# Patient Record
Sex: Female | Born: 1965 | Race: White | Hispanic: No | Marital: Single | Smoking: Never smoker
Health system: Southern US, Community
[De-identification: ages and names within clinical notes are randomized; demographics above are authoritative.]

## PROBLEM LIST (undated history)

## (undated) DIAGNOSIS — R569 Unspecified convulsions: Secondary | ICD-10-CM

## (undated) DIAGNOSIS — M549 Dorsalgia, unspecified: Secondary | ICD-10-CM

## (undated) HISTORY — DX: Dorsalgia, unspecified: M54.9

## (undated) HISTORY — DX: Unspecified convulsions: R56.9

---

## 2008-09-23 ENCOUNTER — Encounter: Admission: RE | Admit: 2008-09-23 | Discharge: 2008-09-23 | Payer: Self-pay | Admitting: Family Medicine

## 2012-05-26 ENCOUNTER — Encounter (HOSPITAL_COMMUNITY): Payer: Self-pay

## 2012-05-26 ENCOUNTER — Emergency Department (HOSPITAL_COMMUNITY)
Admission: EM | Admit: 2012-05-26 | Discharge: 2012-05-26 | Disposition: A | Discharge status: Home / Self Care | Payer: Self-pay | Attending: Emergency Medicine | Admitting: Emergency Medicine

## 2012-05-26 ENCOUNTER — Emergency Department (HOSPITAL_COMMUNITY): Payer: Self-pay

## 2012-05-26 DIAGNOSIS — R51 Headache: Secondary | ICD-10-CM | POA: Insufficient documentation

## 2012-05-26 DIAGNOSIS — S0180XA Unspecified open wound of other part of head, initial encounter: Secondary | ICD-10-CM | POA: Insufficient documentation

## 2012-05-26 DIAGNOSIS — R55 Syncope and collapse: Secondary | ICD-10-CM | POA: Insufficient documentation

## 2012-05-26 DIAGNOSIS — W1811XA Fall from or off toilet without subsequent striking against object, initial encounter: Secondary | ICD-10-CM | POA: Insufficient documentation

## 2012-05-26 DIAGNOSIS — S0990XA Unspecified injury of head, initial encounter: Secondary | ICD-10-CM

## 2012-05-26 DIAGNOSIS — R42 Dizziness and giddiness: Secondary | ICD-10-CM | POA: Insufficient documentation

## 2012-05-26 DIAGNOSIS — Z79899 Other long term (current) drug therapy: Secondary | ICD-10-CM | POA: Insufficient documentation

## 2012-05-26 DIAGNOSIS — IMO0002 Reserved for concepts with insufficient information to code with codable children: Secondary | ICD-10-CM

## 2012-05-26 LAB — POCT I-STAT, CHEM 8
BUN: 10 mg/dL (ref 6–23)
Creatinine, Ser: 0.7 mg/dL (ref 0.50–1.10)
Hemoglobin: 13.3 g/dL (ref 12.0–15.0)
TCO2: 24 mmol/L (ref 0–100)

## 2012-05-26 LAB — CBC
HCT: 39 % (ref 36.0–46.0)
MCH: 28.8 pg (ref 26.0–34.0)
MCV: 83.7 fL (ref 78.0–100.0)
RDW: 13 % (ref 11.5–15.5)
WBC: 5.6 10*3/uL (ref 4.0–10.5)

## 2012-05-26 MED ORDER — BACITRACIN 500 UNIT/GM EX OINT
1.0000 "application " | TOPICAL_OINTMENT | Freq: Two times a day (BID) | CUTANEOUS | Status: DC
  Administered 2012-05-26: 1 via TOPICAL
  Filled 2012-05-26: qty 0.9

## 2012-05-26 MED ORDER — NAPROXEN 500 MG PO TABS: 500.0000 mg | ORAL_TABLET | Freq: Two times a day (BID) | ORAL | Status: AC

## 2012-05-26 NOTE — ED Notes (Signed)
Per EMS, pt was sitting on a toilet, felt dizzy and doesn't remember anything after that. Pt. Found face down on floor by daughter. Laceration to head, bleeding controlled. Alert and oriented x4.

## 2012-05-26 NOTE — ED Notes (Signed)
Prescription given with discharge instructions.  

## 2012-05-26 NOTE — ED Provider Notes (Signed)
History     CSN: [id number]  Arrival date & time 05/26/12  0346   First MD Initiated Contact with Patient 05/26/12 0356      Chief Complaint  Patient presents with  . Fall  . Head Laceration    (Consider location/radiation/quality/duration/timing/severity/associated sxs/prior treatment) HPI Comments: 46 year old female who states that she had a syncopal episode while she was sitting on the commode. This occurred just prior to arrival, she fell face first hitting a metal bar on her shower stall causing a laceration of her for head on the right side, she does not remember this head injury, she awoke on the floor face down with lots of blood.   The patient states that she went to sleep feeling well last night, no complaints of chest pain, palpitations, abdominal pain, dizziness, change in vision, numbness weakness or difficulty walking. At this time her only complaint is a headache. Her medics found her with normal vital signs, no abnormal neurologic deficits and immobilized with a c-collar and backboard.   The history is provided by the patient, the EMS personnel and a relative.    History reviewed. No pertinent past medical history.  History reviewed. No pertinent past surgical history.  No family history on file.  History  Substance Use Topics  . Smoking status: Not on file  . Smokeless tobacco: Not on file  . Alcohol Use: Not on file    OB History    Grav Para Term Preterm Abortions TAB SAB Ect Mult Living                  Review of Systems  All other systems reviewed and are negative.    Allergies  Review of patient's allergies indicates no known allergies.  Home Medications   Current Outpatient Rx  Name Route Sig Dispense Refill  . IBUPROFEN 200 MG PO TABS Oral Take 400 mg by mouth every 6 (six) hours as needed. Headache or pain    . NAPROXEN 500 MG PO TABS Oral Take 1 tablet (500 mg total) by mouth 2 (two) times daily with a meal. 30 tablet 0    BP 131/84   Pulse 74  Temp 98.3 F (36.8 C) (Oral)  Resp 18  SpO2 98%  LMP 04/28/2012  Physical Exam  Nursing note and vitals reviewed. Constitutional: She appears well-developed and well-nourished. No distress.  HENT:  Head: Normocephalic.  Mouth/Throat: Oropharynx is clear and moist. No oropharyngeal exudate.       Laceration to right forehead, no hemotympanum, malocclusion, raccoon eyes, battle sign  Eyes: Conjunctivae normal and EOM are normal. Pupils are equal, round, and reactive to light. Right eye exhibits no discharge. Left eye exhibits no discharge. No scleral icterus.  Neck: No JVD present. No thyromegaly present.  Cardiovascular: Normal rate, regular rhythm, normal heart sounds and intact distal pulses.  Exam reveals no gallop and no friction rub.   No murmur heard. Pulmonary/Chest: Effort normal and breath sounds normal. No respiratory distress. She has no wheezes. She has no rales. She exhibits no tenderness.  Abdominal: Soft. Bowel sounds are normal. She exhibits no distension and no mass. There is no tenderness.  Musculoskeletal: Normal range of motion. She exhibits no edema and no tenderness.       No tenderness over the cervical thoracic or lumbar spines  Lymphadenopathy:    She has no cervical adenopathy.  Neurological: She is alert. Coordination normal.       Speech is clear, movements are normal,, coordination  is normal, no limb ataxia, memory is intact other than short period during syncopal event.  Skin: Skin is warm and dry. No rash noted. No erythema.       Laceration to right forehead  Psychiatric: She has a normal mood and affect. Her behavior is normal.    ED Course  Procedures (including critical care time)  Labs Reviewed  POCT I-STAT, CHEM 8 - Abnormal; Notable for the following:    Glucose, Bld 124 (*)     All other components within normal limits  CBC   Ct Head Wo Contrast  05/26/2012  *RADIOLOGY REPORT*  Clinical Data:  Headache.  Dizziness.  Fall.   Laceration to the head.  CT HEAD WITHOUT CONTRAST CT CERVICAL SPINE WITHOUT CONTRAST  Technique:  Multidetector CT imaging of the head and cervical spine was performed following the standard protocol without intravenous contrast.  Multiplanar CT image reconstructions of the cervical spine were also generated.  Comparison:   None  CT HEAD  Findings: The ventricles and sulci are symmetrical without significant effacement, displacement, or dilatation. No mass effect or midline shift. No abnormal extra-axial fluid collections. The grey-white matter junction is distinct. Basal cisterns are not effaced. No acute intracranial hemorrhage. No depressed skull fractures.  Visualized paranasal sinuses and mastoid air cells are not opacified.  IMPRESSION: No acute intracranial abnormalities.  CT CERVICAL SPINE  Findings: Technically limited study due to motion artifact.  Normal alignment of the cervical vertebrae and facet joints.  Degenerative changes with disc space narrowing and endplate hypertrophic changes throughout.  Degenerative changes in the facet joints.  Normal alignment of the facet joints.  No vertebral compression deformities.  No prevertebral soft tissue swelling.  Lateral masses of C1 are symmetrical.  The odontoid process appears intact.  No focal bone lesion or bone destruction.  Bone cortex and trabecular architecture appear intact.  Incidental note of a nodule in the left lung apex posteriorly measuring about 10 mm diameter.  IMPRESSION: Degenerative changes throughout the cervical spine.  No displaced fractures identified.    Indeterminate 1-cm nodule in the left lung apex.  Chest CT recommended for further evaluation.   Original Report Authenticated By: Marlon Pel, M.D.    Ct Cervical Spine Wo Contrast  05/26/2012  *RADIOLOGY REPORT*  Clinical Data:  Headache.  Dizziness.  Fall.  Laceration to the head.  CT HEAD WITHOUT CONTRAST CT CERVICAL SPINE WITHOUT CONTRAST  Technique:  Multidetector CT  imaging of the head and cervical spine was performed following the standard protocol without intravenous contrast.  Multiplanar CT image reconstructions of the cervical spine were also generated.  Comparison:   None  CT HEAD  Findings: The ventricles and sulci are symmetrical without significant effacement, displacement, or dilatation. No mass effect or midline shift. No abnormal extra-axial fluid collections. The grey-white matter junction is distinct. Basal cisterns are not effaced. No acute intracranial hemorrhage. No depressed skull fractures.  Visualized paranasal sinuses and mastoid air cells are not opacified.  IMPRESSION: No acute intracranial abnormalities.  CT CERVICAL SPINE  Findings: Technically limited study due to motion artifact.  Normal alignment of the cervical vertebrae and facet joints.  Degenerative changes with disc space narrowing and endplate hypertrophic changes throughout.  Degenerative changes in the facet joints.  Normal alignment of the facet joints.  No vertebral compression deformities.  No prevertebral soft tissue swelling.  Lateral masses of C1 are symmetrical.  The odontoid process appears intact.  No focal bone lesion or bone destruction.  Bone cortex and trabecular architecture appear intact.  Incidental note of a nodule in the left lung apex posteriorly measuring about 10 mm diameter.  IMPRESSION: Degenerative changes throughout the cervical spine.  No displaced fractures identified.    Indeterminate 1-cm nodule in the left lung apex.  Chest CT recommended for further evaluation.   Original Report Authenticated By: Marlon Pel, M.D.      1. Minor head injury   2. Laceration       MDM  CT scan to rule out cranial hemorrhage, fracture, cervical spine injury. EKG appears normal without any signs of arrhythmia or ischemia, check and to rule out anemia, electrolytes, laceration repair. Patient appears well at this time  ED ECG REPORT  I personally interpreted this  EKG   Date: 05/26/2012   Rate: 73  Rhythm: normal sinus rhythm  QRS Axis: normal  Intervals: normal  ST/T Wave abnormalities: normal  Conduction Disutrbances:none  Narrative Interpretation:   Old EKG Reviewed: none available   CT scan reviewed, no signs of intracranial hemorrhage, brain injury or cervical spine injury. Laceration repaired, patient is at baseline mental status, she has had no seizures, vomiting, changes in vision and is at her baseline at this time.  LACERATION REPAIR Performed by: Vida Roller Authorized by: Vida Roller Consent: Verbal consent obtained. Risks and benefits: risks, benefits and alternatives were discussed Consent given by: patient Patient identity confirmed: provided demographic data Prepped and Draped in normal sterile fashion Wound explored  Laceration Location: Right for head  Laceration Length: 7 cm  No Foreign Bodies seen or palpated  Anesthesia: local infiltration  Local anesthetic: lidocaine 1 % with epinephrine  Anesthetic total: 6 ml  Irrigation method: syringe Amount of cleaning: standard  Skin closure: 6-0 Prolene  Number of sutures: 11   Technique: Simple interrupted   Patient tolerance: Patient tolerated the procedure well with no immediate complications.       Vida Roller, MD 05/26/12 [phone]

## 2013-01-07 ENCOUNTER — Encounter: Payer: Self-pay | Admitting: Neurology

## 2013-01-07 ENCOUNTER — Other Ambulatory Visit (INDEPENDENT_AMBULATORY_CARE_PROVIDER_SITE_OTHER): Payer: BC Managed Care – PPO | Admitting: Radiology

## 2013-01-07 ENCOUNTER — Ambulatory Visit (INDEPENDENT_AMBULATORY_CARE_PROVIDER_SITE_OTHER): Payer: BC Managed Care – PPO | Admitting: Neurology

## 2013-01-07 VITALS — BP 120/87 | Ht 66.0 in | Wt 232.0 lb

## 2013-01-07 DIAGNOSIS — R569 Unspecified convulsions: Secondary | ICD-10-CM

## 2013-01-07 MED ORDER — LEVETIRACETAM ER 500 MG PO TB24: 1000.0000 mg | ORAL_TABLET | Freq: Every day | ORAL | Status: DC

## 2013-01-07 NOTE — Progress Notes (Signed)
Brittany Pearson is a 47 years old right-handed Caucasian female, accompanied by her mother, referred by her primary care physician for evaluation of seizure  She reported  a history of history of seizures since age 48, in her twenties, she had about 8-9 generalize tonic-clonic seizure, she was treated with Dilantin,  Depakote, Because of the frequent recurrent seizure, all her antileptic medications was stopped.   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   Constitutional:   N/A Cardiovascular:  N/A Ear/Nose/Throat:  N/A Skin: moles Eyes: N/A Respiratory: snoring Gastroitestinal: N/A    Hematology/Lymphatic:  N/A Endocrine:  N/A Musculoskeletal: joint pain Allergy/Immunology: N/A Neurological:  Memory loss, headache, dizziness, seizure, snoring Psychiatric:    N/A  PHYSICAL EXAMINATOINS:  Generalized: In no acute distress  Neck: Supple, no carotid bruits   Cardiac: Regular rate rhythm  Pulmonary: Clear to auscultation bilaterally  Musculoskeletal: No deformity  Neurological examination  Mentation: Alert oriented to time, place, history taking, and causual conversation  Cranial nerve II-XII: Pupils were equal round reactive to light extraocular movements were full, visual field were full on confrontational test. facial sensation and strength were normal. hearing was intact to finger rubbing  bilaterally. Uvula tongue midline.  head turning and shoulder shrug and were normal and symmetric.Tongue protrusion into cheek strength was normal.  Motor: normal tone, bulk and strength.  Sensory: Intact to fine touch, pinprick, preserved vibratory sensation, and proprioception at toes.  Coordination: Normal finger to nose, heel-to-shin bilaterally there was no truncal ataxia  Gait: Rising up from seated position without assistance, normal stance, without trunk ataxia, moderate stride, good arm swing, smooth turning, able to perform tiptoe, and heel walking without difficulty.   Romberg signs: Negative  Deep tendon reflexes: Brachioradialis 2/2, biceps 2/2, triceps 2/2, patellar 2/2, Achilles 2/2, plantar responses were flexor bilaterally.   Assessment and plan:  48 Years old Caucasian female, with past medical history of generalized seizure, now presenting with recurrent seizure, normal neurological examination  1. MRI brain w/wo 2. EEG. 3. Keppra xr 1000mg  every night 4. no driving until seizure free for 6 months,  5, return to clinic with Eber Jones in 6 months

## 2013-01-07 NOTE — Patient Instructions (Signed)
No driving until seizure-free for 6 months 

## 2013-01-10 ENCOUNTER — Other Ambulatory Visit: Payer: BC Managed Care – PPO | Admitting: Radiology

## 2013-01-11 NOTE — Procedures (Signed)
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 One channel was dedicated to EKG, which has demonstrates normal sinus rhythm of 72 beats per minutes.  Upon awakening, the posterior background activity was well-developed, in alpha range, 9-10 Hz , reactive to eye opening and closure.  There was no evidence of epilepsy for discharge.  Photic stimulation was performed, which induced a symmetric photic driving.  Hyperventilation was performed, there was no abnormality elicit.  Stage II sleep was achieved, as evident by K complex, sleep spindles  CONCLUSION: This is a  normal awake and asleep EEG.  There is no electrodiagnostic evidence of epileptiform discharge

## 2013-01-27 ENCOUNTER — Ambulatory Visit (INDEPENDENT_AMBULATORY_CARE_PROVIDER_SITE_OTHER): Payer: BC Managed Care – PPO

## 2013-01-27 DIAGNOSIS — R569 Unspecified convulsions: Secondary | ICD-10-CM

## 2013-01-28 MED ORDER — GADOBENATE DIMEGLUMINE 529 MG/ML IV SOLN
20.0000 mL | Freq: Once | INTRAVENOUS | Status: AC | PRN
  Administered 2013-01-27: 20 mL via INTRAVENOUS

## 2013-03-03 ENCOUNTER — Other Ambulatory Visit: Payer: Self-pay

## 2013-03-03 MED ORDER — LEVETIRACETAM ER 500 MG PO TB24: 1000.0000 mg | ORAL_TABLET | Freq: Every day | ORAL | Status: DC

## 2013-07-11 ENCOUNTER — Ambulatory Visit: Payer: BC Managed Care – PPO | Admitting: Neurology

## 2013-11-05 ENCOUNTER — Other Ambulatory Visit: Payer: Self-pay | Admitting: Neurology

## 2013-11-08 NOTE — Telephone Encounter (Signed)
Patient no showed last appt.  I called patient, got no answer.  Left message asking that she call back and reschedule appt.

## 2013-12-05 ENCOUNTER — Ambulatory Visit (INDEPENDENT_AMBULATORY_CARE_PROVIDER_SITE_OTHER): Payer: BC Managed Care – PPO | Admitting: Neurology

## 2013-12-05 ENCOUNTER — Encounter (INDEPENDENT_AMBULATORY_CARE_PROVIDER_SITE_OTHER): Payer: Self-pay

## 2013-12-05 ENCOUNTER — Encounter: Payer: Self-pay | Admitting: Neurology

## 2013-12-05 VITALS — BP 137/87 | HR 78 | Ht 67.0 in | Wt 234.0 lb

## 2013-12-05 DIAGNOSIS — G40409 Other generalized epilepsy and epileptic syndromes, not intractable, without status epilepticus: Secondary | ICD-10-CM | POA: Insufficient documentation

## 2013-12-05 DIAGNOSIS — G40309 Generalized idiopathic epilepsy and epileptic syndromes, not intractable, without status epilepticus: Secondary | ICD-10-CM

## 2013-12-05 MED ORDER — LEVETIRACETAM ER 500 MG PO TB24: 1000.0000 mg | ORAL_TABLET | Freq: Every day | ORAL | Status: DC

## 2013-12-05 NOTE — Progress Notes (Signed)
Brittany Pearson is a 48 years old right-handed Caucasian female, accompanied by her mother, referred by her primary care physician for evaluation of seizure  She reported  a history of history of seizures since age 48, in her twenties, she had about 8-9 generalize tonic-clonic seizure, she was treated with Dilantin,  Depakote, Because she has no recurrent seizure, all her antileptic medications was stopped.   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 MRI of the brain with without contrast, was normal, EEG was normal   Review of systems   Out of a complete 14 system review, the patient complains of only the following symptoms, and all other reviewed systems are negative.  Joint pain, back pain, runny nose   PHYSICAL EXAMINATOINS:  Generalized: In no acute distress  Neck: Supple, no carotid bruits   Cardiac: Regular rate rhythm  Pulmonary: Clear to auscultation bilaterally  Musculoskeletal: No deformity  Neurological examination  Mentation: Alert oriented to time, place, history taking, and causual conversation  Cranial nerve II-XII: Pupils were equal round reactive to light extraocular movements were full, visual field were full on confrontational test. facial sensation and strength were normal. hearing was intact to finger rubbing bilaterally. Uvula tongue midline.  head turning and shoulder shrug and were normal and symmetric.Tongue protrusion into cheek  strength was normal.  Motor: normal tone, bulk and strength.  Sensory: Intact to fine touch, pinprick, preserved vibratory sensation, and proprioception at toes.  Coordination: Normal finger to nose, heel-to-shin bilaterally there was no truncal ataxia  Gait: Rising up from seated position without assistance, mild  atalgic gait due to right foot pain.   Romberg signs: Negative  Deep tendon reflexes: Brachioradialis 2/2, biceps 2/2, triceps 2/2, patellar 2/2, Achilles 2/2, plantar responses were flexor bilaterally.   Assessment and plan:  48 Years old Caucasian female, with past medical history of generalized seizure, now presenting with recurrent seizure, normal neurological examination  1 refill her Keppra xr 500 mg 2 tablets a day  2, return to clinic in one year with Eber Jonesarolyn

## 2014-09-18 ENCOUNTER — Encounter: Payer: Self-pay | Admitting: Adult Health

## 2014-09-18 ENCOUNTER — Ambulatory Visit (INDEPENDENT_AMBULATORY_CARE_PROVIDER_SITE_OTHER): Payer: BLUE CROSS/BLUE SHIELD | Admitting: Adult Health

## 2014-09-18 ENCOUNTER — Ambulatory Visit: Payer: BC Managed Care – PPO | Admitting: Nurse Practitioner

## 2014-09-18 VITALS — BP 125/83 | HR 81 | Ht 68.0 in | Wt 245.5 lb

## 2014-09-18 DIAGNOSIS — G40409 Other generalized epilepsy and epileptic syndromes, not intractable, without status epilepticus: Secondary | ICD-10-CM

## 2014-09-18 DIAGNOSIS — G40309 Generalized idiopathic epilepsy and epileptic syndromes, not intractable, without status epilepticus: Secondary | ICD-10-CM

## 2014-09-18 MED ORDER — LEVETIRACETAM ER 500 MG PO TB24: 1500.0000 mg | ORAL_TABLET | Freq: Every day | ORAL | Status: DC

## 2014-09-18 NOTE — Progress Notes (Signed)
PATIENT: Brittany Pearson DOB: Jul 14, 1966  REASON FOR VISIT: follow up HISTORY FROM: patient  HISTORY OF PRESENT ILLNESS: Ms. Brittany Pearson is a 49 year old female with a history of seizures. She returns today for follow-up. She is currently taking Keppra 1000 mg at bedtime. She reports that her seizures have been controlled until last week. She has not had a seizure but continues to have the sensation that a seizure is coming. Her chest will get tight and  lightheaded as if she is going to pass out. She states that she will sit down and that feeling will slowly subside. The last time this occur was last Thursday but before that it occurred 5 days in a row. This is the same sensation that has occurred in the past before having a seizure. Her daughter just had a baby and they live with her. She thinks that is causing her a lot of stress. In the past she states that stress has caused her to have seizures. She states that she is sleeping well at night.   HISTORY 12/05/13 [name]): by her primary care physician for evaluation of seizure She reported a history of history of seizures since age 52, in her twenties, she had about 8-9 generalize tonic-clonic seizure, she was treated with Dilantin, Depakote, Because she has no recurrent seizure, all her antileptic medications was stopped. 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 MRI of the brain with without contrast, was normal,  EEG was normal  REVIEW OF SYSTEMS: Out of a complete 14 system review of symptoms, the patient complains only of the following symptoms, and all other reviewed systems are negative.  Fatigue, chest tightness, agitation, depression, dizziness, weakness   ALLERGIES: No Known Allergies  HOME MEDICATIONS: Outpatient Prescriptions Prior to Visit  Medication Sig Dispense Refill  . fluticasone (FLONASE) 50 MCG/ACT nasal spray     . ibuprofen (ADVIL,MOTRIN) 200 MG tablet Take 400 mg by mouth every 6 (six) hours as needed. Headache or pain    . levETIRAcetam (KEPPRA XR) 500 MG 24 hr tablet Take 2 tablets (1,000 mg total) by mouth at bedtime. 190 tablet 3   No facility-administered medications prior to visit.    PAST MEDICAL HISTORY: Past Medical History  Diagnosis Date  . Seizure   . Back pain     PAST SURGICAL HISTORY: Past Surgical History  Procedure Laterality Date  . Cesarean section  2006    FAMILY HISTORY: Family History  Problem Relation Age of Onset  . High blood pressure Mother     SOCIAL HISTORY: History   Social History  . Marital Status: Married    Spouse Name: N/A    Number of Children: 2  . Years of Education: 12   Occupational History  . Cook    Social History Main Topics  . Smoking status: Never Smoker   . Smokeless tobacco: Never Used  . Alcohol Use: 0.6 oz/week    1  Glasses of wine per week     Comment: OCC  . Drug Use: No  . Sexual Activity: Not on file   Other Topics Concern  . Not on file   Social History Narrative   Patient lives at home with her children she is divorced.   Patient works full time for holiday retirement and part time for [employer].   Education high school education.   Right handed   Caffeine six cups daily of soda.            PHYSICAL EXAM  Filed Vitals:   09/18/14 1504  BP: 125/83  Pulse: 81  Height:  (1.727 m)  Weight: 245 lb 8 oz (111.358 kg)   Body mass index is 37.34  kg/(m^2).  Generalized: Well developed, in no acute distress   Neurological examination  Mentation: Alert oriented to time, place, history taking. Follows all commands speech and language fluent Cranial nerve II-XII: Pupils were equal round reactive to light. Extraocular movements were full, visual field were full on confrontational test. Facial sensation and strength were normal. Uvula tongue midline. Head turning and shoulder shrug  were normal and symmetric. Motor: The motor testing reveals 5 over 5 strength of all 4 extremities. Good symmetric motor tone is noted throughout.  Sensory: Sensory testing is intact to soft touch on all 4 extremities. No evidence of extinction is noted.  Coordination: Cerebellar testing reveals good finger-nose-finger and heel-to-shin bilaterally.  Gait and station: Gait is normal. Tandem gait is normal. Romberg is negative. No drift is seen.  Reflexes: Deep tendon reflexes are symmetric and normal bilaterally.    DIAGNOSTIC DATA (LABS, IMAGING, TESTING) - I reviewed patient records, labs, notes, testing and imaging myself where available.  Lab Results  Component Value Date   WBC 5.6 05/26/2012   HGB 13.3 05/26/2012   HCT 39.0 05/26/2012   MCV 83.7 05/26/2012   PLT 172 05/26/2012      Component Value Date/Time   NA 139 05/26/2012 0415   K 3.7 05/26/2012 0415   CL 103 05/26/2012 0415   GLUCOSE 124* 05/26/2012 0415   BUN 10 05/26/2012 0415   CREATININE 0.70 05/26/2012 0415      ASSESSMENT AND PLAN 49 y.o. year old female  has a past medical history of Seizure and Back pain. here with:  1. Seizures  The patient states that starting last week she had 5 days of feeling like she is going to have a seizure. She states that this same sensation has occurred in the past when a seizure did occur. I will increase the Keppra XR to 1500 mg daily at bedtime. She is to let us know if this sensation continues or if she actually has a seizure. She will follow  up in 3 months or sooner if needed.    Butch Penny, MSN, NP-C 09/18/2014, 3:01 PM [facility] [address] [place], [place] [address] [phone]  Note: This document was prepared with digital dictation and possible smart phrase technology. Any transcriptional errors that result from this process are unintentional.

## 2014-09-18 NOTE — Patient Instructions (Signed)
Increase Keppra to 1500 mg at bedtime.  If you have any seizures please let us know.  If you continue to have the sensation that a seizure is going to occur please let us know so your medication can continue to be adjusted.

## 2014-09-22 NOTE — Progress Notes (Signed)
I agree above plan. 

## 2014-10-31 ENCOUNTER — Telehealth: Payer: Self-pay

## 2014-10-31 NOTE — Telephone Encounter (Signed)
Called patient and spoke to her about rescheduling her apt. With [facility]. 12-06-14 at 3:00 . [place] and she only can have Afternoon apt's . Patient will see Dr.Yan.

## 2014-12-05 ENCOUNTER — Ambulatory Visit (INDEPENDENT_AMBULATORY_CARE_PROVIDER_SITE_OTHER): Payer: BLUE CROSS/BLUE SHIELD | Admitting: Neurology

## 2014-12-05 ENCOUNTER — Encounter: Payer: Self-pay | Admitting: Neurology

## 2014-12-05 VITALS — BP 139/87 | HR 84 | Ht 68.0 in | Wt 234.0 lb

## 2014-12-05 DIAGNOSIS — G40309 Generalized idiopathic epilepsy and epileptic syndromes, not intractable, without status epilepticus: Secondary | ICD-10-CM

## 2014-12-05 DIAGNOSIS — G40409 Other generalized epilepsy and epileptic syndromes, not intractable, without status epilepticus: Secondary | ICD-10-CM

## 2014-12-05 MED ORDER — LEVETIRACETAM ER 500 MG PO TB24: 1500.0000 mg | ORAL_TABLET | Freq: Every day | ORAL | Status: DC

## 2014-12-05 NOTE — Progress Notes (Signed)
PATIENT: Brittany Pearson DOB: [birth year]  REASON FOR VISIT: follow up HISTORY FROM: patient  HISTORY OF PRESENT ILLNESS: Brittany Pearson is a 49 year old female with a history of seizures. She returns today for follow-up. She is currently taking Keppra 1000 mg at bedtime. She reports that her seizures have been controlled until last week. She has not had a seizure but continues to have the sensation that a seizure is coming. Her chest will get tight and  lightheaded as if she is going to pass out. She states that she will sit down and that feeling will slowly subside. The last time this occur was last Thursday but before that it occurred 5 days in a row. This is the same sensation that has occurred in the past before having a seizure. Her daughter just had a baby and they live with her. She thinks that is causing her a lot of stress. In the past she states that stress has caused her to have seizures. She states that she is sleeping well at night.   HISTORY 12/05/13 [name]): by her primary care physician for evaluation of seizure  She reported a history of history of seizures since age 53, in her twenties, she had about 8-9 generalize tonic-clonic seizure, she was treated with Dilantin, Depakote, Because she has no recurrent seizure, all her antileptic medications was stopped. Last seizure was in 1999, in April 5th 2014 while at work, she felt lightheaded, sitting outside, then had generalized seizure, witnessed by her coworker, woke up on the stretcher, she was taken by the ambulance to [facility], she had bowel and bladder incontinence, she works as a Investment banker, operational, she drove one hour to [employer] every day.She already has history of chronic low back pain, now worsening pain since seizure, midline low back, no shooting pain, no bowel bladder incontinence  UPDATE December 05 2013: She is taking keppra xr  2 tabs qhs, she has no side effect, no recurrent seizure. MRI of the brain with without contrast, was  normal, EEG was normal  UPDATE March 22nd 2016: She has no recurrent seizure like events after Keppra xr was increased to 500 mg 3 tablets every night no significant side effect noticed  REVIEW OF SYSTEMS: Out of a complete 14 system review of symptoms, the patient complains only of the following symptoms, and all other reviewed systems are negative. As above   ALLERGIES: No Known Allergies  HOME MEDICATIONS: Outpatient Prescriptions Prior to Visit  Medication Sig Dispense Refill  . levETIRAcetam (KEPPRA XR) 500 MG 24 hr tablet Take 3 tablets (1,500 mg total) by mouth at bedtime. 270 tablet 3  . fluticasone (FLONASE) 50 MCG/ACT nasal spray     . ibuprofen (ADVIL,MOTRIN) 200 MG tablet Take 400 mg by mouth every 6 (six) hours as needed. Headache or pain     No facility-administered medications prior to visit.    PAST MEDICAL HISTORY: Past Medical History  Diagnosis Date  . Seizure   . Back pain     PAST SURGICAL HISTORY: Past Surgical History  Procedure Laterality Date  . Cesarean section  2006    FAMILY HISTORY: Family History  Problem Relation Age of Onset  . High blood pressure Mother     SOCIAL HISTORY: History   Social History  . Marital Status: Married    Spouse Name: N/A  . Number of Children: 2  . Years of Education: 12   Occupational History  . Cook    Social History Main Topics  .  Smoking status: Never Smoker   . Smokeless tobacco: Never Used  . Alcohol Use: 0.6 oz/week    1 Glasses of wine per week     Comment: OCC  . Drug Use: No  . Sexual Activity: Not on file   Other Topics Concern  . Not on file   Social History Narrative   Patient lives at home with her children she is divorced.   Patient works full time for holiday retirement and part time for [employer].   Education high school education.   Right handed   Caffeine six cups daily of soda.            PHYSICAL EXAM  Filed Vitals:   12/05/14 1552  BP: 139/87  Pulse: 84    Height: 5\' 8"  (1.727 m)  Weight: 234 lb (106.142 kg)   Body mass index is 35.59 kg/(m^2).  PHYSICAL EXAMNIATION:  Gen: NAD, conversant, well nourised, obese, well groomed                     Cardiovascular: Regular rate rhythm, no peripheral edema, warm, nontender. Eyes: Conjunctivae clear without exudates or hemorrhage Neck: Supple, no carotid bruise. Pulmonary: Clear to auscultation bilaterally   NEUROLOGICAL EXAM:  MENTAL STATUS: Speech:    Speech is normal; fluent and spontaneous with normal comprehension.  Cognition:    The patient is oriented to person, place, and time;     recent and remote memory intact;     language fluent;     normal attention, concentration,     fund of knowledge.  CRANIAL NERVES: CN II: Visual fields are full to confrontation. Fundoscopic exam is normal with sharp discs and no vascular changes. Venous pulsations are present bilaterally. Pupils are 4 mm and briskly reactive to light. Visual acuity is 20/20 bilaterally. CN III, IV, VI: extraocular movement are normal. No ptosis. CN V: Facial sensation is intact to pinprick in all 3 divisions bilaterally. Corneal responses are intact.  CN VII: Face is symmetric with normal eye closure and smile. CN VIII: Hearing is normal to rubbing fingers CN IX, X: Palate elevates symmetrically. Phonation is normal. CN XI: Head turning and shoulder shrug are intact CN XII: Tongue is midline with normal movements and no atrophy.  MOTOR: There is no pronator drift of out-stretched arms. Muscle bulk and tone are normal. Muscle strength is normal.   Shoulder abduction Shoulder external rotation Elbow flexion Elbow extension Wrist flexion Wrist extension Finger abduction Hip flexion Knee flexion Knee extension Ankle dorsi flexion Ankle plantar flexion  R 5 5 5 5 5 5 5 5 5 5 5 5   L 5 5 5 5 5 5 5 5 5 5 5 5     REFLEXES: Reflexes are 2+ and symmetric at the biceps, triceps, knees, and ankles. Plantar responses are  flexor.  SENSORY: Light touch, pinprick, position sense, and vibration sense are intact in fingers and toes.  COORDINATION: Rapid alternating movements and fine finger movements are intact. There is no dysmetria on finger-to-nose and heel-knee-shin. There are no abnormal or extraneous movements.   GAIT/STANCE: Posture is normal. Gait is steady with normal steps, base, arm swing, and turning. Heel and toe walking are normal. Tandem gait is normal.  Romberg is absent.    DIAGNOSTIC DATA (LABS, IMAGING, TESTING) - I reviewed patient records, labs, notes, testing and imaging myself where available.  Lab Results  Component Value Date   WBC 5.6 05/26/2012   HGB 13.3 05/26/2012  HCT 39.0 05/26/2012   MCV 83.7 05/26/2012   PLT 172 05/26/2012      Component Value Date/Time   NA 139 05/26/2012 0415   K 3.7 05/26/2012 0415   CL 103 05/26/2012 0415   GLUCOSE 124* 05/26/2012 0415   BUN 10 05/26/2012 0415   CREATININE 0.70 05/26/2012 0415      ASSESSMENT AND PLAN 49 y.o. year old female  has a past medical history of Seizure and Back pain. follow-up for seizure disorder, she no longer has recurrent aurs after increase Keppra xr to  3 tabs qhs    Refill her meds, RTC in one year  Levert Feinstein, M.D. Ph.D.  [facility] [address] [place], [place] [address] Phone: [phone] Fax:      [phone]

## 2014-12-06 ENCOUNTER — Ambulatory Visit: Payer: BC Managed Care – PPO | Admitting: Nurse Practitioner

## 2014-12-19 ENCOUNTER — Ambulatory Visit: Payer: BLUE CROSS/BLUE SHIELD | Admitting: Adult Health

## 2015-09-23 ENCOUNTER — Emergency Department (HOSPITAL_COMMUNITY)
Admission: EM | Admit: 2015-09-23 | Discharge: 2015-09-23 | Disposition: A | Discharge status: Home / Self Care | Payer: Worker's Compensation | Attending: Emergency Medicine | Admitting: Emergency Medicine

## 2015-09-23 ENCOUNTER — Emergency Department (HOSPITAL_COMMUNITY): Payer: Worker's Compensation

## 2015-09-23 ENCOUNTER — Encounter (HOSPITAL_COMMUNITY): Payer: Self-pay

## 2015-09-23 DIAGNOSIS — S79912A Unspecified injury of left hip, initial encounter: Secondary | ICD-10-CM | POA: Insufficient documentation

## 2015-09-23 DIAGNOSIS — W000XXA Fall on same level due to ice and snow, initial encounter: Secondary | ICD-10-CM | POA: Insufficient documentation

## 2015-09-23 DIAGNOSIS — Y998 Other external cause status: Secondary | ICD-10-CM | POA: Diagnosis not present

## 2015-09-23 DIAGNOSIS — S52122A Displaced fracture of head of left radius, initial encounter for closed fracture: Secondary | ICD-10-CM | POA: Diagnosis not present

## 2015-09-23 DIAGNOSIS — Y9289 Other specified places as the place of occurrence of the external cause: Secondary | ICD-10-CM | POA: Diagnosis not present

## 2015-09-23 DIAGNOSIS — Z79899 Other long term (current) drug therapy: Secondary | ICD-10-CM | POA: Diagnosis not present

## 2015-09-23 DIAGNOSIS — Y9389 Activity, other specified: Secondary | ICD-10-CM | POA: Diagnosis not present

## 2015-09-23 DIAGNOSIS — S4992XA Unspecified injury of left shoulder and upper arm, initial encounter: Secondary | ICD-10-CM | POA: Diagnosis present

## 2015-09-23 DIAGNOSIS — S299XXA Unspecified injury of thorax, initial encounter: Secondary | ICD-10-CM | POA: Insufficient documentation

## 2015-09-23 LAB — CBC WITH DIFFERENTIAL/PLATELET
BASOS ABS: 0 10*3/uL (ref 0.0–0.1)
Basophils Relative: 0 %
Eosinophils Absolute: 0.1 10*3/uL (ref 0.0–0.7)
Eosinophils Relative: 1 %
HEMATOCRIT: 43.4 % (ref 36.0–46.0)
Hemoglobin: 14.8 g/dL (ref 12.0–15.0)
LYMPHS ABS: 1.9 10*3/uL (ref 0.7–4.0)
LYMPHS PCT: 21 %
MCH: 28.5 pg (ref 26.0–34.0)
MCHC: 34.1 g/dL (ref 30.0–36.0)
MCV: 83.5 fL (ref 78.0–100.0)
MONO ABS: 0.6 10*3/uL (ref 0.1–1.0)
MONOS PCT: 7 %
NEUTROS ABS: 6.5 10*3/uL (ref 1.7–7.7)
Neutrophils Relative %: 71 %
Platelets: 260 10*3/uL (ref 150–400)
RBC: 5.2 MIL/uL — ABNORMAL HIGH (ref 3.87–5.11)
RDW: 12.8 % (ref 11.5–15.5)
WBC: 9.2 10*3/uL (ref 4.0–10.5)

## 2015-09-23 LAB — COMPREHENSIVE METABOLIC PANEL
ALT: 29 U/L (ref 14–54)
AST: 36 U/L (ref 15–41)
Albumin: 4.2 g/dL (ref 3.5–5.0)
Alkaline Phosphatase: 67 U/L (ref 38–126)
Anion gap: 9 (ref 5–15)
BILIRUBIN TOTAL: 1.1 mg/dL (ref 0.3–1.2)
BUN: 15 mg/dL (ref 6–20)
CO2: 25 mmol/L (ref 22–32)
CREATININE: 0.77 mg/dL (ref 0.44–1.00)
Calcium: 9.2 mg/dL (ref 8.9–10.3)
Chloride: 105 mmol/L (ref 101–111)
GFR calc Af Amer: >60 mL/min (ref >60)
Glucose, Bld: 111 mg/dL — ABNORMAL HIGH (ref 65–99)
POTASSIUM: 4.5 mmol/L (ref 3.5–5.1)
Sodium: 139 mmol/L (ref 135–145)
TOTAL PROTEIN: 7.7 g/dL (ref 6.5–8.1)

## 2015-09-23 LAB — LIPASE, BLOOD: Lipase: 30 U/L (ref 11–51)

## 2015-09-23 MED ORDER — SODIUM CHLORIDE 0.9 % IV BOLUS (SEPSIS)
1000.0000 mL | Freq: Once | INTRAVENOUS | Status: AC
  Administered 2015-09-23: 1000 mL via INTRAVENOUS

## 2015-09-23 NOTE — ED Notes (Signed)
Patient c/o fall yesterday when she slipped on ice. Patient c/o left arm, left rib, and left hip pain

## 2015-09-23 NOTE — Discharge Instructions (Signed)
Splint and Sling until seen by Orthopedic Hand surgeon.  Radial Head Fracture A radial head fracture is a break of the smaller bone (radius) in the forearm. The head of this bone is the part near the elbow. These fractures commonly happen during a fall, when you land on an outstretched arm. These fractures are more common in middle aged adults and are common with a dislocation of the elbow. SYMPTOMS   Swelling of the elbow joint and pain on the outside of the elbow.  Pain and difficulty in bending or straightening the elbow.  Pain and difficulty in turning the palm of the hand up or down with the elbow bent. DIAGNOSIS  Your caregiver may make this diagnosis by a physical exam. X-rays can confirm the type and amount of fracture. Sometimes a fracture that is not displaced cannot be seen on the original X-ray. TREATMENT  Radial head fractures are classified according to the amount of movement (displacement) of parts from the normal position.  Type 1 Fractures  Type 1 fractures are generally small fractures in which bone pieces remain together (nondisplaced fracture).  The fracture may not be seen on initial X-rays. Usually if X-rays are repeated two to three weeks later, the fracture will show up. A splint or sling is used for a few days. Gentle early motion is used to prevent the elbow from becoming stiff. It should not be done vigorously or forced as this could displace the bone pieces. Type 2 Fractures  With type 2 fractures, bone pieces are slightly displaced and larger pieces of bone are broken off.  If only a little displacement of the bone piece is present, splinting for 4 to 5 days usually works well. This is again followed with gentle active range of motion. Small fragments may be surgically removed.  Large pieces of bone that can be put back into place will sometimes be fixed with pins or screws to hold them until the bone is healed. If this cannot be done, the fragments are removed.  For older, less active people, sometimes the entire radial head is removed if the wrist is not injured. The elbow and arm will still work fine. Soft tissue, tendon, and ligament injuries are corrected at the same time. Type 3 Fractures  Type 3 fractures have multiple broken pieces of bone that cannot be fixed. Surgery is usually needed to remove the broken bits of bone and what is left of the radial head. Soft-tissue damage is repaired. Gentle early motion is used to prevent the elbow from becoming stiff. Sometimes an artificial radial head can be used to prevent deformity if the elbow is unstable. Rest, ice, elevation, immobilization, medications, and pain control are used in the early care. HOME CARE INSTRUCTIONS   Keep the injured part elevated while sitting or lying down. Keep the injury above the level of your heart (the center of the chest). This will decrease swelling and pain.  Apply ice to the injury for 15-20 minutes, 03-04 times per day while awake, for 2 days. Put the ice in a plastic bag and place a towel between the bag of ice and your cast or splint.  Move your fingers to avoid stiffness and minimize swelling.  If you have a plaster or fiberglass cast:  Do not try to scratch the skin under the cast using sharp or pointed objects.  Check the skin around the cast every day. You may put lotion on any red or sore areas.  Keep your cast  dry and clean.  If you have a plaster splint:  Wear the splint as directed.  You may loosen the elastic around the splint if your fingers become numb, tingle, or turn cold or blue.  Do not put pressure on any part of your cast or splint. It may break. Rest your cast only on a pillow for the first 24 hours until it is fully hardened.  Your cast or splint can be protected during bathing with a plastic bag. Do not lower the cast or splint into the water.  Only take over-the-counter or prescription medicines for pain, discomfort, or fever as  directed by your caregiver.  Follow all instructions for follow-up with your caregiver. This includes any orthopedic referrals, physical therapy, and rehabilitation. Any delay in obtaining necessary care could result in a delay or failure of the bones to heal or permanent elbow stiffness.  Do not overdo exercises. This could further damage your injury. SEEK IMMEDIATE MEDICAL CARE IF:   Your cast or splint gets damaged or breaks.  You have more severe pain or swelling than you did before getting the cast.  You have severe pain when stretching your fingers.  There is a bad smell, new stains, and/or pus-like (purulent) drainage coming from under the cast.  Your fingers or hand turn pale or blue, become cold, or you lose feeling.   This information is not intended to replace advice given to you by your health care provider. Make sure you discuss any questions you have with your health care provider.   Document Released: 06/23/2006 Document Revised: 09/22/2014 Document Reviewed: 03/14/2015 Elsevier Interactive Patient Education [employer]2016 Elsevier Inc.

## 2015-09-23 NOTE — ED Notes (Signed)
Pt reported lt arm, lt elbow, and lt rib pain s/p to slipping and falling. No obvious deformity to lt arm/leg, (+)PMS, CRT brisk, no bruising/swelling noted, LROM to lt arm, able to bear weight to lt leg.

## 2015-09-23 NOTE — ED Provider Notes (Signed)
CSN: [id number]     Arrival date & time 09/23/15  [id number] History   First MD Initiated Contact with Patient 09/23/15 [phone]     Chief Complaint  Patient presents with  . Fall  . Arm Pain  . Hip Pain     HPI  Patient presents for patient after a slip and fall this morning. Complains of pain in the left hip. Fell against an abducted left arm. Also pain in left lower ribs. Did not strike her head. No neck or back pain. No abdominal pain.  Past Medical History  Diagnosis Date  . Seizure (HCC)   . Back pain    Past Surgical History  Procedure Laterality Date  . Cesarean section  2006   Family History  Problem Relation Age of Onset  . High blood pressure Mother    Social History  Substance Use Topics  . Smoking status: Never Smoker   . Smokeless tobacco: Never Used  . Alcohol Use: 0.6 oz/week    1 Glasses of wine per week     Comment: OCC   OB History    No data available     Review of Systems  Constitutional: Negative for fever, chills, diaphoresis, appetite change and fatigue.  HENT: Negative for mouth sores, sore throat and trouble swallowing.   Eyes: Negative for visual disturbance.  Respiratory: Negative for cough, chest tightness, shortness of breath and wheezing.   Cardiovascular: Negative for chest pain.  Gastrointestinal: Negative for nausea, vomiting, abdominal pain, diarrhea and abdominal distention.  Endocrine: Negative for polydipsia, polyphagia and polyuria.  Genitourinary: Negative for dysuria, frequency and hematuria.  Musculoskeletal: Positive for arthralgias and gait problem.  Skin: Negative for color change, pallor and rash.  Neurological: Negative for dizziness, syncope, light-headedness and headaches.  Hematological: Does not bruise/bleed easily.  Psychiatric/Behavioral: Negative for behavioral problems and confusion.      Allergies  Review of patient's allergies indicates no known allergies.  Home Medications   Prior to Admission medications     Medication Sig Start Date End Date Taking? Authorizing Provider  levETIRAcetam (KEPPRA XR) 500 MG 24 hr tablet Take 3 tablets (1,500 mg total) by mouth at bedtime. 12/05/14  Yes Levert FeinsteinYijun Yan, MD  fluticasone Aleda Grana(FLONASE) 50 MCG/ACT nasal spray Reported on 09/23/2015 10/25/13   Historical Provider, MD   BP 110/68 mmHg  Pulse 79  Temp(Src) 98.6 F (37 C) (Oral)  Resp 18  SpO2 98% Physical Exam  Constitutional: She is oriented to person, place, and time. She appears well-developed and well-nourished. No distress.  HENT:  Head: Normocephalic.  Eyes: Conjunctivae are normal. Pupils are equal, round, and reactive to light. No scleral icterus.  Neck: Normal range of motion. Neck supple. No thyromegaly present.  Cardiovascular: Normal rate and regular rhythm.  Exam reveals no gallop and no friction rub.   No murmur heard. Pulmonary/Chest: Effort normal and breath sounds normal. No respiratory distress. She has no wheezes. She has no rales.    Abdominal: Soft. Bowel sounds are normal. She exhibits no distension. There is no tenderness. There is no rebound.  Musculoskeletal: Normal range of motion.       Arms:      Legs: Neurological: She is alert and oriented to person, place, and time.  Skin: Skin is warm and dry. No rash noted.  Psychiatric: She has a normal mood and affect. Her behavior is normal.    ED Course  Procedures (including critical care time) Labs Review Labs Reviewed  CBC WITH DIFFERENTIAL/PLATELET -  Abnormal; Notable for the following:    RBC 5.20 (*)    All other components within normal limits  COMPREHENSIVE METABOLIC PANEL - Abnormal; Notable for the following:    Glucose, Bld 111 (*)    All other components within normal limits  LIPASE, BLOOD    Imaging Review Dg Ribs Unilateral W/chest Left  09/23/2015  CLINICAL DATA:  Patient status post fall.  Left-sided chest pain. EXAM: LEFT RIBS AND CHEST - 3+ VIEW COMPARISON:  None. FINDINGS: Normal cardiac and mediastinal  contours. Low lung volumes. Calcified granuloma right mid lung. No pleural effusion or pneumothorax. No definite displaced left-sided rib fracture. IMPRESSION: No acute cardiopulmonary process. No displaced left-sided rib fracture. Electronically Signed   By: Annia Belt M.D.   On: 09/23/2015 09:50   Dg Elbow Complete Left  09/23/2015  CLINICAL DATA:  Fall in parking lot today on ice. Left elbow pain. Initial encounter. EXAM: LEFT ELBOW - COMPLETE 3+ VIEW COMPARISON:  None. FINDINGS: There is a comminuted, intra-articular, mildly displaced fracture of the radial head. There is a moderate elbow joint effusion. Irregularity of the coronoid process of the ulna is favored to be degenerative although a nondisplaced fracture is not excluded. There is no dislocation. No lytic or blastic osseous lesion or radiopaque foreign body is seen. IMPRESSION: 1. Comminuted, mildly displaced radial head fracture. 2. Moderate elbow joint effusion. 3. Irregularity of the coronoid process of the ulna favored to be degenerative with nondisplaced fracture considered less likely. Electronically Signed   By: Sebastian Ache M.D.   On: 09/23/2015 10:00   Dg Hip Unilat With Pelvis 2-3 Views Left  09/23/2015  CLINICAL DATA:  Patient status post fall. Left rib, left rib and hip pain. EXAM: DG HIP (WITH OR WITHOUT PELVIS) 2-3V LEFT COMPARISON:  None. FINDINGS: Normal anatomic alignment. No evidence for acute fracture or dislocation. Left hip joint degenerative changes. Lumbar spine degenerative changes. SI joints are unremarkable. IMPRESSION: No acute osseous abnormality. Electronically Signed   By: Annia Belt M.D.   On: 09/23/2015 09:54   I have personally reviewed and evaluated these images and lab results as part of my medical decision-making.   EKG Interpretation None      MDM   Final diagnoses:  Radial head fracture, left, closed, initial encounter    No additional acute abnormalities noted. Placed in his posterior splint.  Sling. Had surgical referral. Declines pain medication.  SPLINT APPLICATION Date/Time: 3:42 PM Authorized by: Claudean Kinds Consent: Verbal consent obtained. Risks and benefits: risks, benefits and alternatives were discussed Consent given by: patient Splint applied by: orthopedic technician Location details: LUE posterior Splint type: Posterior long arm Supplies used: Ortho glass, ace, sling Post-procedure: The splinted body part was neurovascularly unchanged following the procedure. Patient tolerance: Patient tolerated the procedure well with no immediate complications.       Rolland Porter, MD 09/23/15 1544

## 2015-09-23 NOTE — ED Notes (Signed)
Awake. Verbally responsive. A/O x4. Resp even and unlabored. No audible adventitious breath sounds noted. ABC's intact. IV infusing NS at 999ml/hr without difficulty. Family at bedside. 

## 2015-09-24 LAB — CBG MONITORING, ED: GLUCOSE-CAPILLARY: 105 mg/dL — AB (ref 65–99)

## 2015-10-25 ENCOUNTER — Telehealth: Payer: Self-pay | Admitting: Nurse Practitioner

## 2015-10-25 MED ORDER — LEVETIRACETAM ER 500 MG PO TB24: 1500.0000 mg | ORAL_TABLET | Freq: Every day | ORAL | Status: DC

## 2015-10-25 NOTE — Telephone Encounter (Signed)
Rx has been sent to [facility] to last until appt.  I called the patient, she expressed understanding and appreciation.   (It appears this patient has been seen previously by Dr [name] and Aundra Millet, but has follow up scheduled with Eber Jones)

## 2015-10-25 NOTE — Telephone Encounter (Signed)
Patient said that she use to get her refills through the mail and she doesn't get it that way anymore. And she needs a refill on Keppra 500 MG and she said she needs them quick because she running out. The best number to contact is [phone]

## 2015-12-10 ENCOUNTER — Ambulatory Visit: Payer: BLUE CROSS/BLUE SHIELD | Admitting: Nurse Practitioner

## 2015-12-17 ENCOUNTER — Ambulatory Visit: Payer: Commercial Managed Care - PPO | Admitting: Nurse Practitioner

## 2016-01-15 ENCOUNTER — Ambulatory Visit: Payer: Commercial Managed Care - PPO | Admitting: Nurse Practitioner

## 2016-01-16 ENCOUNTER — Encounter: Payer: Self-pay | Admitting: Nurse Practitioner

## 2016-01-29 ENCOUNTER — Ambulatory Visit (INDEPENDENT_AMBULATORY_CARE_PROVIDER_SITE_OTHER): Payer: Commercial Managed Care - PPO | Admitting: Nurse Practitioner

## 2016-01-29 ENCOUNTER — Encounter: Payer: Self-pay | Admitting: Nurse Practitioner

## 2016-01-29 VITALS — BP 118/74 | HR 84 | Ht 68.0 in | Wt 250.0 lb

## 2016-01-29 DIAGNOSIS — G40409 Other generalized epilepsy and epileptic syndromes, not intractable, without status epilepticus: Secondary | ICD-10-CM

## 2016-01-29 DIAGNOSIS — G40309 Generalized idiopathic epilepsy and epileptic syndromes, not intractable, without status epilepticus: Secondary | ICD-10-CM

## 2016-01-29 DIAGNOSIS — Z0289 Encounter for other administrative examinations: Secondary | ICD-10-CM

## 2016-01-29 MED ORDER — LEVETIRACETAM ER 500 MG PO TB24: 1500.0000 mg | ORAL_TABLET | Freq: Every day | ORAL | Status: DC

## 2016-01-29 NOTE — Patient Instructions (Signed)
Continue Keppra at current dose, refilled locally for one month then needs mail order Call for any seizure activity Follow-up yearly and when necessary

## 2016-01-29 NOTE — Progress Notes (Signed)
[facility]  PATIENT: Brittany Pearson DOB: 1965-12-27   REASON FOR VISIT: Follow-up for seizure disorder HISTORY FROM: Patient    HISTORY OF PRESENT ILLNESS Brittany Pearson is a 50 year old female with a history of seizures. She returns today for follow-up. She is currently taking Keppra 1000 mg at bedtime. She reports that her seizures have been controlled until last week. She has not had a seizure but continues to have the sensation that a seizure is coming. Her chest will get tight and lightheaded as if she is going to pass out. She states that she will sit down and that feeling will slowly subside. The last time this occur was last Thursday but before that it occurred 5 days in a row. This is the same sensation that has occurred in the past before having a seizure. Her daughter just had a baby and they live with her. She thinks that is causing her a lot of stress. In the past she states that stress has caused her to have seizures. She states that she is sleeping well at night.   HISTORY 12/05/13 [name]): by her primary care physician for evaluation of seizure  She reported a history of history of seizures since age 74, in her twenties, she had about 8-9 generalize tonic-clonic seizure, she was treated with Dilantin, Depakote, Because she has no recurrent seizure, all her antileptic medications was stopped. 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 MRI of the brain with without contrast, was normal, EEG was normal  UPDATE March 22nd 2016:YY She has no recurrent seizure like events after Keppra xr was increased to 500 mg 3 tablets every night no significant side effect noticed  UPDATE 01/29/2016 [facility]. Brittany Pearson, 50 year old female returns for yearly follow-up. She has history of seizure disorder and has previously been followed by Dr. [name]. Records reviewed. Last seizure occurred in 12/18/2012. She is currently on Keppra 500 mg 3 tablets at night without any side effects. She needs refills. She returns for reevaluation REVIEW OF SYSTEMS: Full 14 system review of systems performed and notable only for those listed, all others are neg:  Constitutional: neg  Cardiovascular: neg Ear/Nose/Throat: neg  Skin: neg Eyes: neg Respiratory: neg Gastroitestinal: neg  Hematology/Lymphatic: neg  Endocrine: neg Musculoskeletal:neg Allergy/Immunology: neg Neurological: Rare headache, history of seizure disorder Psychiatric: neg Sleep : neg   ALLERGIES: No Known Allergies  HOME MEDICATIONS: Outpatient Prescriptions Prior to Visit  Medication Sig Dispense Refill  . levETIRAcetam (KEPPRA XR) 500 MG 24 hr tablet Take 3 tablets (1,500 mg total) by mouth at bedtime. 90 tablet 1  . fluticasone (FLONASE) 50 MCG/ACT nasal spray Reported on 01/29/2016     No facility-administered medications prior to visit.    PAST MEDICAL HISTORY: Past Medical History  Diagnosis Date  . Seizure (HCC)   . Back pain     PAST SURGICAL HISTORY: Past Surgical History  Procedure Laterality Date  . Cesarean section  2006    FAMILY HISTORY: Family History  Problem Relation Age of Onset  . High blood pressure Mother     SOCIAL HISTORY: Social History   Social History  . Marital Status: Married    Spouse Name: N/A  . Number of Children: 2  . Years of Education: 12   Occupational History  . Cook    Social History Main Topics  .  Smoking status: Never Smoker   . Smokeless tobacco: Never Used  . Alcohol Use: 0.6 oz/week    1 Glasses of wine per week     Comment: OCC  . Drug Use: No  . Sexual Activity: Not on file   Other Topics Concern  . Not on file   Social History Narrative   Patient lives at home with her children she is divorced.   Patient works full time for holiday retirement and part time for [employer].   Education high school education.   Right handed   Caffeine six cups daily of soda.           PHYSICAL EXAM  Filed Vitals:   01/29/16 1530  BP: 118/74  Pulse: 84  Height: 5\' 8"  (1.727 m)  Weight: 250 lb (113.399 kg)   Body mass index is 38.02 kg/(m^2).  Generalized: Well developed, Obese female in no acute distress  Head: normocephalic and atraumatic,. Oropharynx benign  Neck: Supple, no carotid bruits  Cardiac: Regular rate rhythm, no murmur  Neurological examination   Mentation: Alert oriented to time, place, history taking. Attention span and concentration appropriate. Recent and remote memory intact.  Follows all commands speech and language fluent.   Cranial nerve II-XII: Pupils were equal round reactive to light extraocular movements were full, visual field were full on confrontational test. Facial sensation and strength were normal. hearing was intact to finger rubbing bilaterally. Uvula tongue midline. head turning and shoulder shrug were normal and symmetric.Tongue protrusion into cheek strength was normal. Motor: normal bulk and tone, full strength in the BUE, BLE, fine finger movements normal, no pronator drift. No focal weakness Sensory: normal and symmetric to light touch, pinprick, and  Vibration,  Coordination: finger-nose-finger, heel-to-shin bilaterally, no dysmetria Reflexes: Symmetric upper and lower plantar responses were flexor bilaterally. Gait and Station: Rising up from seated position without assistance, normal stance,  moderate stride, good arm swing, smooth  turning, able to perform tiptoe, and heel walking without difficulty. Tandem gait is steady  DIAGNOSTIC DATA (LABS, IMAGING, TESTING) - I reviewed patient records, labs, notes, testing and imaging myself where available.  Lab Results  Component Value Date   WBC 9.2 09/23/2015   HGB 14.8 09/23/2015   HCT 43.4 09/23/2015   MCV 83.5 09/23/2015   PLT 260 09/23/2015      Component Value Date/Time   NA 139 09/23/2015 0916   K 4.5 09/23/2015 0916   CL 105 09/23/2015 0916   CO2 25 09/23/2015 0916   GLUCOSE 111* 09/23/2015 0916   BUN 15 09/23/2015 0916   CREATININE 0.77 09/23/2015 0916   CALCIUM 9.2 09/23/2015 0916   PROT 7.7 09/23/2015 0916   ALBUMIN 4.2 09/23/2015 0916   AST 36 09/23/2015 0916   ALT 29 09/23/2015 0916   ALKPHOS 67 09/23/2015 0916   BILITOT 1.1 09/23/2015 0916   GFRNONAA >60 09/23/2015 0916   GFRAA >60 09/23/2015 0916    ASSESSMENT AND PLAN [age] y.o. year old female  has a past medical history of Seizure and Back pain. follow-up for seizure disorder,  she no longer has recurrent auras after increase Keppra xr to 500mg  3 tabs qhs.Last seizure in 2014  PLANContinue Keppra at current dose, refilled locally for one month then needs mail order Call for any seizure activity Follow-up yearly and when necessary Nilda Riggs, [facility], [facility], APRN  [facility] [address] [place], [place] [address] [phone]

## 2016-01-30 NOTE — Progress Notes (Signed)
I have reviewed and agreed above plan. 

## 2016-02-22 ENCOUNTER — Telehealth: Payer: Self-pay | Admitting: Neurology

## 2016-02-22 MED ORDER — LEVETIRACETAM ER 500 MG PO TB24: 1500.0000 mg | ORAL_TABLET | Freq: Every day | ORAL | Status: DC

## 2016-02-22 NOTE — Telephone Encounter (Signed)
Pt called [facility] told her RX for levETIRAcetam (KEPPRA XR) 500 MG 24 hr tablet has been faxed to clinic twice. Please send RX, this will be the 1st time pharmacy has filled it for her. She has 1 week left.

## 2016-02-22 NOTE — Telephone Encounter (Signed)
I have ordered Keppra xr 500mg  3 tabs po qhs, 270 tabs with 4 refills

## 2016-09-19 IMAGING — CR DG RIBS W/ CHEST 3+V*L*
5 series · 5 of 5 positions shown · non-contrast
Comparison: None.

CLINICAL DATA: Patient status post fall.  Left-sided chest pain.

EXAM:
LEFT RIBS AND CHEST - 3+ VIEW

[w chest pa]
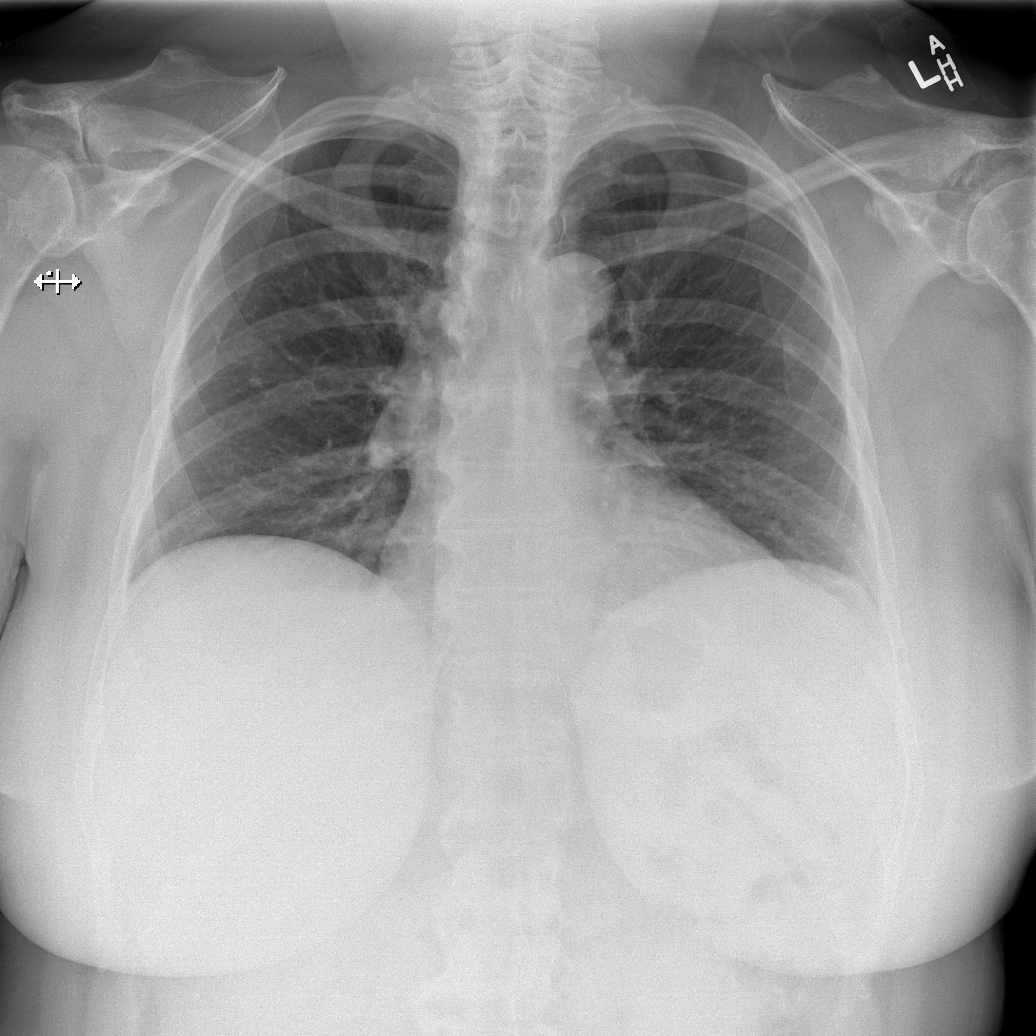

[w ribs ap upper left]
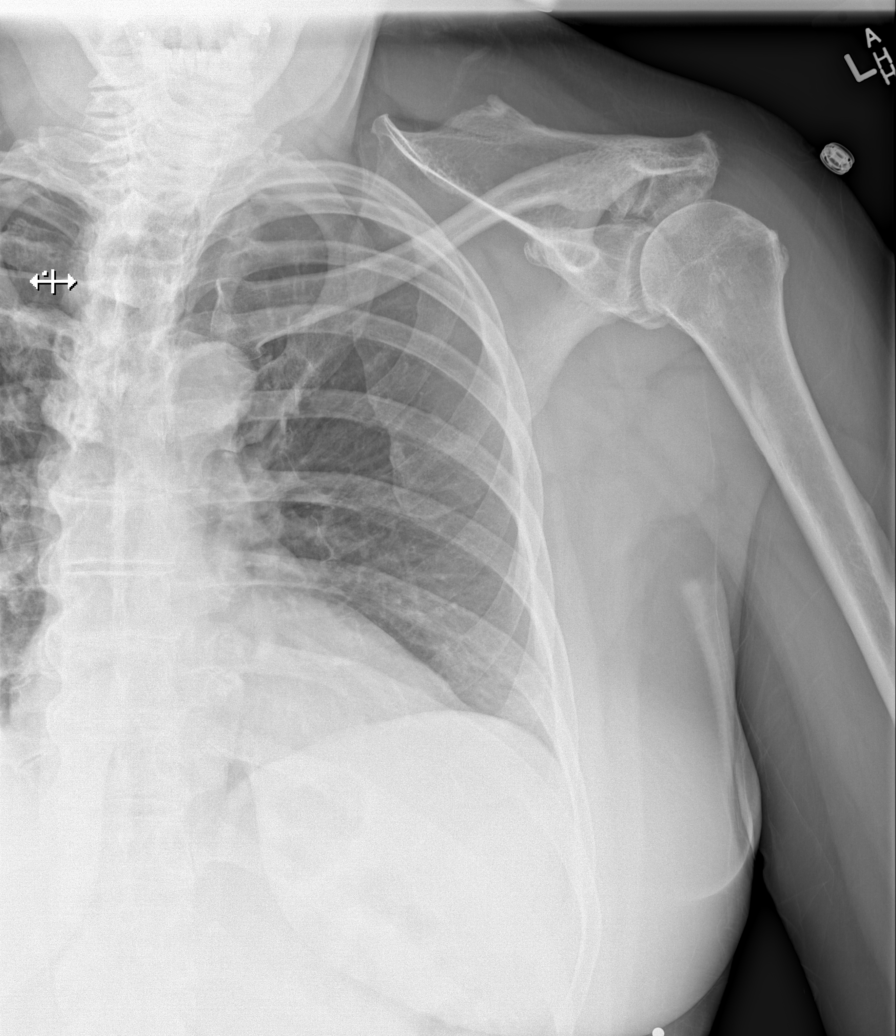

[w ribs ap lower left]
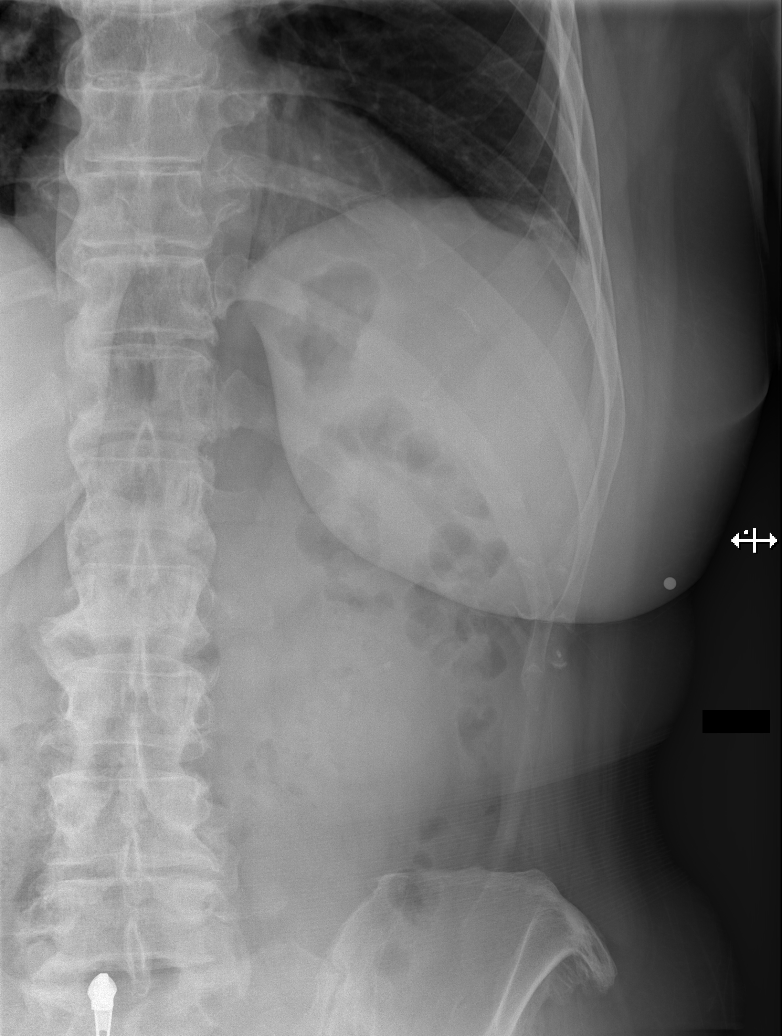

[w ribs obl left (1 of 2)]
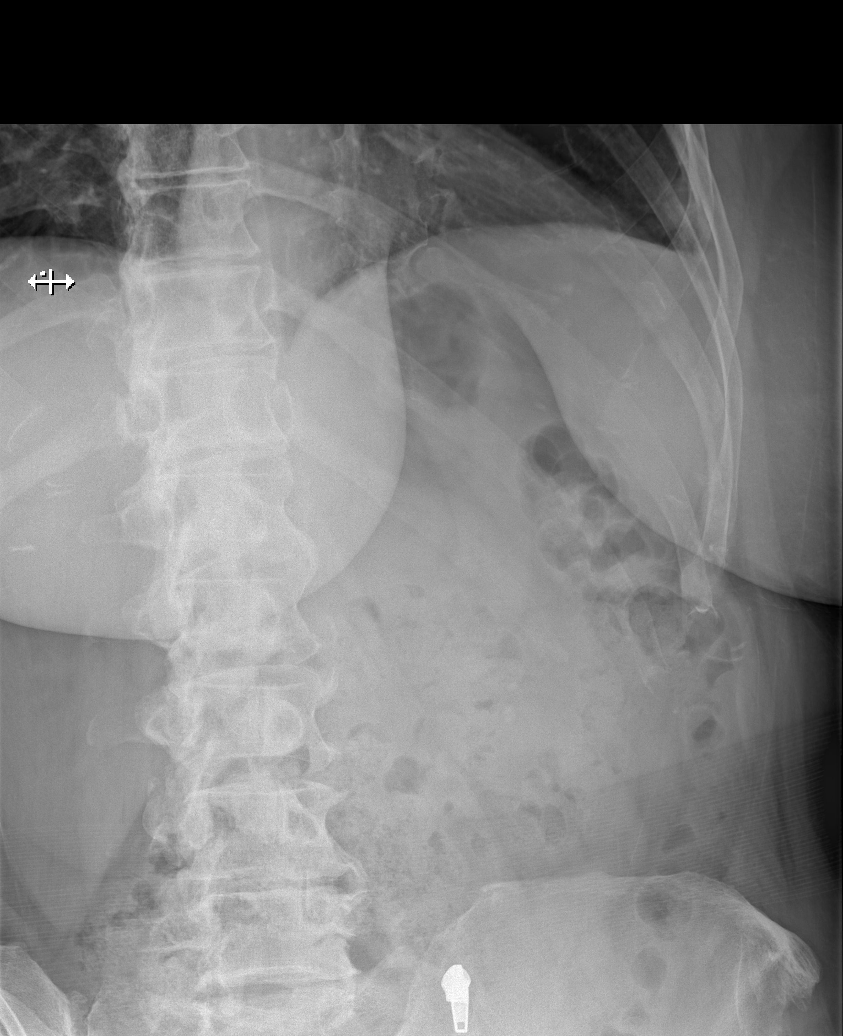

[w ribs obl left (2 of 2)]
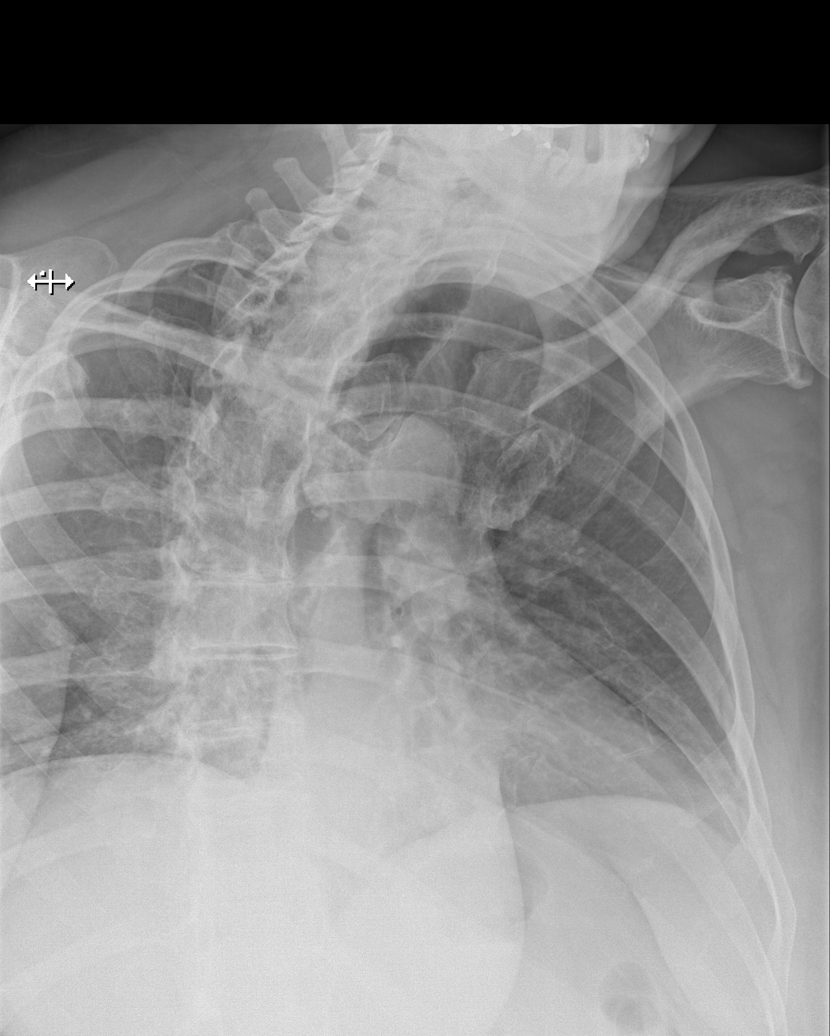

[5 of 5 positions shown; findings below may reference images not displayed]

FINDINGS: Normal cardiac and mediastinal contours. Low lung volumes. Calcified
granuloma right mid lung. No pleural effusion or pneumothorax. No
definite displaced left-sided rib fracture.
IMPRESSION: No acute cardiopulmonary process.

No displaced left-sided rib fracture.

## 2017-01-28 ENCOUNTER — Encounter (INDEPENDENT_AMBULATORY_CARE_PROVIDER_SITE_OTHER): Payer: Self-pay

## 2017-01-28 ENCOUNTER — Ambulatory Visit (INDEPENDENT_AMBULATORY_CARE_PROVIDER_SITE_OTHER): Payer: Commercial Managed Care - PPO | Admitting: Nurse Practitioner

## 2017-01-28 ENCOUNTER — Encounter: Payer: Self-pay | Admitting: Nurse Practitioner

## 2017-01-28 VITALS — BP 126/81 | HR 81 | Ht 68.0 in | Wt 231.4 lb

## 2017-01-28 DIAGNOSIS — G40309 Generalized idiopathic epilepsy and epileptic syndromes, not intractable, without status epilepticus: Secondary | ICD-10-CM

## 2017-01-28 DIAGNOSIS — G40409 Other generalized epilepsy and epileptic syndromes, not intractable, without status epilepticus: Secondary | ICD-10-CM

## 2017-01-28 MED ORDER — LEVETIRACETAM ER 500 MG PO TB24: 1500.0000 mg | ORAL_TABLET | Freq: Every day | ORAL | 3 refills | Status: DC

## 2017-01-28 NOTE — Progress Notes (Signed)
[facility]  PATIENT: Brittany Pearson DOB: July 01, 1966   REASON FOR VISIT: Follow-up for seizure disorder HISTORY FROM: Patient    HISTORY OF PRESENT ILLNESS Brittany Pearson is a 51 year old female with a history of seizures. She returns today for follow-up. She is currently taking Keppra 1000 mg at bedtime. She reports that her seizures have been controlled until last week. She has not had a seizure but continues to have the sensation that a seizure is coming. Her chest will get tight and lightheaded as if she is going to pass out. She states that she will sit down and that feeling will slowly subside. The last time this occur was last Thursday but before that it occurred 5 days in a row. This is the same sensation that has occurred in the past before having a seizure. Her daughter just had a baby and they live with her. She thinks that is causing her a lot of stress. In the past she states that stress has caused her to have seizures. She states that she is sleeping well at night.   HISTORY 12/05/13 Brittany Pearson): by her primary care physician for evaluation of seizure  She reported a history of history of seizures since age 63, in her twenties, she had about 8-9 generalize tonic-clonic seizure, she was treated with Dilantin, Depakote, Because she has no recurrent seizure, all her antileptic medications was stopped. 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 MRI of the brain with without contrast, was normal, EEG was normal  UPDATE March 22nd 2016:Brittany Pearson She has no recurrent seizure like events after Keppra xr was increased to 500 mg 3 tablets every night no significant side effect noticed  UPDATE 01/29/2016 Brittany Pearson, 51 year old female returns for yearly follow-up. She has history of seizure disorder and has previously been followed by Dr. Terrace Pearson. Records reviewed. Last seizure occurred in 12/18/2012. She is currently on Keppra 500 mg 3 tablets at night without any side effects. She needs refills. She returns for reevaluation UPDATE 05/16/2018CM Ms. Pearson, 51 year old female returns for follow-up with history of seizure disorder and last seizure occurring in April 2014. She is currently on Keppra 500 mg XR 3 tablets at bedtime. She denies any side effects to the medication. She claims that she snores but she denies any daytime drowsiness. She doesn't get much exercise. She works as a Investment banker, operational in a  Brittany Pearson ,she returns for reevaluation REVIEW OF SYSTEMS: Full 14 system review of systems performed and notable only for those listed, all others are neg:  Constitutional: neg  Cardiovascular: neg Ear/Nose/Throat: neg  Skin: neg Eyes: neg Respiratory: neg Gastroitestinal: neg  Hematology/Lymphatic: neg  Endocrine: neg Musculoskeletal:neg Allergy/Immunology: neg Neurological: history of seizure disorder Psychiatric: neg Sleep : Snoring   ALLERGIES: Allergies  Allergen Reactions  . Codeine Nausea Only    Can tolerate hydrocodone.    HOME MEDICATIONS: Outpatient Medications Prior to Visit  Medication Sig Dispense Refill  .  levETIRAcetam (KEPPRA XR) 500 MG 24 hr tablet Take 3 tablets (1,500 mg total) by mouth at bedtime. 270 tablet 4   No facility-administered medications prior to visit.     PAST MEDICAL HISTORY: Past Medical History:  Diagnosis Date  . Back pain   . Seizure (HCC)     PAST SURGICAL  HISTORY: Past Surgical History:  Procedure Laterality Date  . CESAREAN SECTION  2006    FAMILY HISTORY: Family History  Problem Relation Age of Onset  . High blood pressure Mother     SOCIAL HISTORY: Social History   Social History  . Marital status: Married    Spouse name: N/A  . Number of children: 2  . Years of education: 12   Occupational History  . Cook Engineer, technical sales   Social History Main Topics  . Smoking status: Never Smoker  . Smokeless tobacco: Never Used  . Alcohol use 0.6 oz/week    1 Glasses of wine per week     Comment: OCC  . Drug use: No  . Sexual activity: Not on file   Other Topics Concern  . Not on file   Social History Narrative   Patient lives at home with her children she is divorced.   Patient works full time for [facility]) and part time for [facility].   Education high school education.   Right handed   Caffeine six cups daily of soda.           PHYSICAL EXAM  Vitals:   01/28/17 1457  BP: 126/81  Pulse: 81  Weight: 231 lb 6.4 oz (105 kg)  Height: 5\' 8"  (1.727 m)   Body mass index is 35.18 kg/m.  Generalized: Well developed, Obese female in no acute distress  Head: normocephalic and atraumatic,. Oropharynx benign  Neck: Supple, no carotid bruits  Cardiac: Regular rate rhythm, no murmur  Neurological examination   Mentation: Alert oriented to time, place, history taking. Attention span and concentration appropriate. Recent and remote memory intact.  Follows all commands speech and language fluent.   Cranial nerve II-XII: Pupils were equal round reactive to light extraocular movements were full, visual field were full on confrontational test. Facial sensation and strength were normal. hearing was intact to finger rubbing bilaterally. Uvula tongue midline. head turning and shoulder shrug were normal and symmetric.Tongue protrusion into cheek strength was normal. Motor: normal bulk and tone, full strength in  the BUE, BLE, fine finger movements normal, no pronator drift. No focal weakness Sensory: normal and symmetric to light touch, pinprick, and  Vibration,In the upper and lower extremities  Coordination: finger-nose-finger, heel-to-shin bilaterally, no dysmetria Reflexes: Symmetric upper and lower plantar responses were flexor bilaterally. Gait and Station: Rising up from seated position without assistance, normal stance,  moderate stride, good arm swing, smooth turning, able to perform tiptoe, and heel walking without difficulty. Tandem gait is steady  DIAGNOSTIC DATA (LABS, IMAGING, TESTING) - I reviewed patient records, labs, notes, testing and imaging myself where available.  Lab Results  Component Value Date   WBC 9.2 09/23/2015   HGB 14.8 09/23/2015   HCT 43.4 09/23/2015   MCV 83.5 09/23/2015   PLT 260 09/23/2015      Component Value Date/Time   NA 139 09/23/2015 0916   K 4.5 09/23/2015 0916   CL 105 09/23/2015 0916   CO2 25 09/23/2015 0916   GLUCOSE 111 (H) 09/23/2015 0916   BUN 15 09/23/2015 0916   CREATININE 0.77 09/23/2015 0916  CALCIUM 9.2 09/23/2015 0916   PROT 7.7 09/23/2015 0916   ALBUMIN 4.2 09/23/2015 0916   AST 36 09/23/2015 0916   ALT 29 09/23/2015 0916   ALKPHOS 67 09/23/2015 0916   BILITOT 1.1 09/23/2015 0916   GFRNONAA >60 09/23/2015 0916   GFRAA >60 09/23/2015 0916    ASSESSMENT AND PLAN 51 y.o. year old female  has a past medical history of Seizure and Back pain. follow-up for seizure disorder, she no longer has recurrent auras after increase Keppra xr to 500mg  3 tabs qhs.Last seizure in 2014  PLANContinue Keppra at current dose, refill mail order Call for any seizure activity Follow-up yearly and when necessary Nilda RiggsNancy Carolyn Martin, Community [facility] HospitalGNP, [facility] Surgery CtrBC, APRN  [facility] [address], [address] [place], [place] [id number] [phone]

## 2017-01-28 NOTE — Progress Notes (Signed)
I have reviewed and agreed above plan. 

## 2017-01-28 NOTE — Patient Instructions (Signed)
Continue Keppra at current dose, refill mail order Call for any seizure activity Follow-up yearly and when necessary

## 2018-02-01 ENCOUNTER — Ambulatory Visit: Payer: Commercial Managed Care - PPO | Admitting: Nurse Practitioner

## 2018-05-10 ENCOUNTER — Ambulatory Visit (INDEPENDENT_AMBULATORY_CARE_PROVIDER_SITE_OTHER): Payer: BLUE CROSS/BLUE SHIELD | Admitting: Nurse Practitioner

## 2018-05-10 ENCOUNTER — Encounter: Payer: Self-pay | Admitting: Nurse Practitioner

## 2018-05-10 VITALS — BP 132/88 | HR 66 | Ht 68.0 in | Wt 211.0 lb

## 2018-05-10 DIAGNOSIS — G40309 Generalized idiopathic epilepsy and epileptic syndromes, not intractable, without status epilepticus: Secondary | ICD-10-CM | POA: Diagnosis not present

## 2018-05-10 DIAGNOSIS — G40409 Other generalized epilepsy and epileptic syndromes, not intractable, without status epilepticus: Secondary | ICD-10-CM

## 2018-05-10 MED ORDER — LEVETIRACETAM ER 500 MG PO TB24: 1500.0000 mg | ORAL_TABLET | Freq: Every day | ORAL | 0 refills | Status: DC

## 2018-05-10 NOTE — Progress Notes (Signed)
[facility]  PATIENT: Brittany Pearson DOB: 1966-06-20   REASON FOR VISIT: Follow-up for seizure disorder HISTORY FROM:patient    HISTORY OF PRESENT ILLNESS: Brittany Pearson is a 52 year old female with a history of seizures. She returns today for follow-up. She is currently taking Keppra 1000 mg at bedtime. She reports that her seizures have been controlled until last week. She has not had a seizure but continues to have the sensation that a seizure is coming. Her chest will get tight and lightheaded as if she is going to pass out. She states that she will sit down and that feeling will slowly subside. The last time this occur was last Thursday but before that it occurred 5 days in a row. This is the same sensation that has occurred in the past before having a seizure. Her daughter just had a baby and they live with her. She thinks that is causing her a lot of stress. In the past she states that stress has caused her to have seizures. She states that she is sleeping well at night.   HISTORY 12/05/13 [name](YAN): by her primary care physician for evaluation of seizure  She reported a history of history of seizures since age 52, in her twenties, she had about 8-9 generalize tonic-clonic seizure, she was treated with Dilantin, Depakote, Because she has no recurrent seizure, all her antileptic medications was stopped. Last seizure was in 1999, in April 5th 2014 while at work, she felt lightheaded, sitting outside, then had generalized seizure, witnessed by her coworker, woke up on the stretcher, [facility], she had bowel and bladder incontinence, she works as a Investment banker, operationalchef, [place].She already has history of chronic low back pain, now worsening pain since seizure, midline low back, no shooting pain, no bowel bladder incontinence  UPDATE December 05 2013:YY She is taking keppra xr 500mg  2 tabs qhs, she has no side effect, no  recurrent seizure. MRI of the brain with without contrast, was normal, EEG was normal  UPDATE March 22nd 2016:YY She has no recurrent seizure like events after Keppra xr was increased to 500 mg 3 tablets every night no significant side effect noticed  UPDATE 01/29/2016 Brittany Pearson, 52 year old female returns for yearly follow-up. She has history of seizure disorder and has previously been followed by Dr. Terrace ArabiaYan. Records reviewed. Last seizure occurred in 12/18/2012. She is currently on Keppra 500 mg 3 tablets at night without any side effects. She needs refills. She returns for reevaluation UPDATE 05/16/2018CM Brittany Pearson, 52 year old female returns for follow-up with history of seizure disorder and last seizure occurring in April 2014. She is currently on Keppra 500 mg XR 3 tablets at bedtime. She denies any side effects to the medication. She claims that she snores but she denies any daytime drowsiness. She doesn't get much exercise. 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  She is currently on Keppra 500mg  3 tablets at bedtime extended release.  She denies side effects to the medication.  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  She returns for reevaluation.  No interval medical issues REVIEW OF SYSTEMS: Full 14 system review of systems performed and notable only for those listed, all others are neg:  Constitutional: neg  Cardiovascular: neg Ear/Nose/Throat: neg  Skin: neg Eyes: neg Respiratory: neg Gastroitestinal: neg  Hematology/Lymphatic: neg  Endocrine: neg Musculoskeletal:neg Allergy/Immunology: neg Neurological: Seizure disorder Psychiatric: neg Sleep : neg   ALLERGIES: Allergies  Allergen Reactions  . Codeine Nausea Only    Can tolerate hydrocodone.    HOME  MEDICATIONS: Outpatient Medications Prior to Visit  Medication Sig Dispense Refill  . levETIRAcetam (KEPPRA XR) 500 MG 24 hr tablet Take 3 tablets (1,500 mg total) by mouth at bedtime. 270 tablet 3   No facility-administered medications prior to visit.     PAST MEDICAL HISTORY: Past Medical History:  Diagnosis Date  . Back pain   . Seizure (HCC)     PAST SURGICAL HISTORY: Past Surgical History:  Procedure Laterality Date  . CESAREAN SECTION  2006    FAMILY HISTORY: Family History  Problem Relation Age of Onset  . High blood pressure Mother     SOCIAL HISTORY: Social History   Socioeconomic History  . Marital status: Married    Spouse name: Not on file  . Number of children: 2  . Years of education: 64  . Highest education level: Not on file  Occupational History  . Occupation: Multimedia programmer: Engineer, technical sales  Social Needs  . Financial resource strain: Not on file  . Food insecurity:    Worry: Not on file    Inability: Not on file  . Transportation needs:    Medical: Not on file    Non-medical: Not on file  Tobacco Use  . Smoking status: Never Smoker  . Smokeless tobacco: Never Used  Substance and Sexual Activity  . Alcohol use: Yes    Alcohol/week: 1.0 standard drinks    Types: 1 Glasses of wine per week    Comment: OCC  . Drug use: No  . Sexual activity: Not on file  Lifestyle  . Physical activity:    Days per week: Not on file    Minutes per session: Not on file  . Stress: Not on file  Relationships  . Social connections:    Talks on phone: Not on file    Gets together: Not on file    Attends religious service: Not on file    Active member of club or organization: Not on file    Attends meetings of clubs or organizations: Not on file    Relationship status: Not on file  . Intimate partner violence:    Fear of current or ex partner: Not on file    Emotionally abused: Not on file    Physically abused: Not on file    Forced sexual activity:  Not on file  Other Topics Concern  . Not on file  Social History Narrative   Patient lives at home with her children she is divorced.   Patient works full time for [employer]) and part time for [employer].   Education high school education.   Right handed   Caffeine six cups daily of soda.        PHYSICAL EXAM  Vitals:   05/10/18 0917  BP: 132/88  Pulse: 66  Weight: 211 lb (95.7 kg)  Height: 5\' 8"  (1.727 m)   Body mass index is 32.08 kg/m.  Generalized: Well developed, in no acute distress  Head: normocephalic and atraumatic,. Oropharynx benign  Neck: Supple,  Musculoskeletal: No deformity  Skin no edema Neurological examination   Mentation: Alert oriented to time, place, history taking. Attention span and concentration appropriate.  Recent and remote memory intact.  Follows all commands speech and language fluent.   Cranial nerve II-XII: Pupils were equal round reactive to light extraocular movements were full, visual field were full on confrontational test. Facial sensation and strength were normal. hearing was intact to finger rubbing bilaterally. Uvula tongue midline. head turning and shoulder shrug were normal and symmetric.Tongue protrusion into cheek strength was normal. Motor: normal bulk and tone, full strength in the BUE, BLE, fine finger movements normal, no pronator drift. No focal weakness Sensory: normal and symmetric to light touch,  Coordination: finger-nose-finger, heel-to-shin bilaterally, no dysmetria Reflexes: Brachioradialis 2/2, biceps 2/2, triceps 2/2, patellar 2/2, Achilles 2/2, plantar responses were flexor bilaterally. Gait and Station: Rising up from seated position without assistance, normal stance,  moderate stride, good arm swing, smooth turning, able to perform tiptoe, and heel walking without difficulty. Tandem gait is steady  DIAGNOSTIC DATA (LABS, IMAGING, TESTING) - I reviewed patient records, labs, notes, testing and  imaging myself where available.  Lab Results  Component Value Date   WBC 9.2 09/23/2015   HGB 14.8 09/23/2015   HCT 43.4 09/23/2015   MCV 83.5 09/23/2015   PLT 260 09/23/2015      Component Value Date/Time   NA 139 09/23/2015 0916   K 4.5 09/23/2015 0916   CL 105 09/23/2015 0916   CO2 25 09/23/2015 0916   GLUCOSE 111 (H) 09/23/2015 0916   BUN 15 09/23/2015 0916   CREATININE 0.77 09/23/2015 0916   CALCIUM 9.2 09/23/2015 0916   PROT 7.7 09/23/2015 0916   ALBUMIN 4.2 09/23/2015 0916   AST 36 09/23/2015 0916   ALT 29 09/23/2015 0916   ALKPHOS 67 09/23/2015 0916   BILITOT 1.1 09/23/2015 0916   GFRNONAA >60 09/23/2015 0916   GFRAA >60 09/23/2015 0916    ASSESSMENT AND PLAN  52 y.o. year old female  has a past medical history of Seizure here to  follow-up for seizure disorder, she no longer has recurrent auras after increase Keppra xr to 500mg  3 tabs qhs.Last seizure in 2014.  PLANContinue Keppra at current dose, refill  1 month local then mail order Call for any seizure activity Follow-up yearly and when necessary Nilda Riggs, [facility], [facility], APRN  [facility], [facility] [address] [place], [place] [address] [phone]

## 2018-05-10 NOTE — Progress Notes (Signed)
I have reviewed and agreed above plan. 

## 2018-05-10 NOTE — Patient Instructions (Signed)
Continue Keppra at current dose, refill  1 month local then mail order Call for any seizure activity Follow-up yearly and when necessary

## 2018-05-11 ENCOUNTER — Other Ambulatory Visit: Payer: Self-pay | Admitting: Nurse Practitioner

## 2018-05-11 MED ORDER — LEVETIRACETAM ER 500 MG PO TB24: 1500.0000 mg | ORAL_TABLET | Freq: Every day | ORAL | 3 refills | Status: DC

## 2019-03-14 ENCOUNTER — Other Ambulatory Visit: Payer: Self-pay | Admitting: *Deleted

## 2019-03-14 MED ORDER — LEVETIRACETAM ER 500 MG PO TB24: 1500.0000 mg | ORAL_TABLET | Freq: Every day | ORAL | 3 refills | Status: DC

## 2019-05-12 ENCOUNTER — Ambulatory Visit: Payer: BLUE CROSS/BLUE SHIELD | Admitting: Neurology

## 2020-04-02 ENCOUNTER — Other Ambulatory Visit: Payer: Self-pay | Admitting: Neurology

## 2021-04-16 ENCOUNTER — Telehealth: Payer: Self-pay | Admitting: Neurology

## 2021-04-16 MED ORDER — LEVETIRACETAM ER 500 MG PO TB24: 1500.0000 mg | ORAL_TABLET | Freq: Every day | ORAL | 0 refills | Status: DC

## 2021-04-16 NOTE — Telephone Encounter (Signed)
Pt has made a med check appt with MD Pt is needing a refill on her levETIRAcetam (KEPPRA XR) 500 MG 24 hr tablet sent in to the [facility] due to her insurance.

## 2021-04-16 NOTE — Telephone Encounter (Signed)
Pending appt 08/05/21. 90-day rx x 0 sent to [facility]. She will need to keep this follow for further refills.

## 2021-06-27 ENCOUNTER — Other Ambulatory Visit: Payer: Self-pay | Admitting: Neurology

## 2021-08-05 ENCOUNTER — Encounter: Payer: Self-pay | Admitting: Neurology

## 2021-08-05 ENCOUNTER — Ambulatory Visit: Payer: BC Managed Care – PPO | Admitting: Neurology

## 2021-09-25 ENCOUNTER — Other Ambulatory Visit: Payer: Self-pay | Admitting: Neurology

## 2021-12-04 NOTE — Telephone Encounter (Signed)
Pt called asking to be scheduled for a f/u visit so that she can get a refill on her medication. Pt was informed that it has been greater than 3 yrs that she last was seen so she is needing to contact her PCP and she is needing to get a new referral in order to be scheduled. Pt expressed her irritation and disconnected call.  ?
